# Patient Record
Sex: Female | Born: 1995 | Race: Black or African American | Hispanic: No | Marital: Single | Smoking: Never smoker
Health system: Southern US, Community
[De-identification: ages and names within clinical notes are randomized; demographics above are authoritative.]

## PROBLEM LIST (undated history)

## (undated) DIAGNOSIS — D573 Sickle-cell trait: Secondary | ICD-10-CM

---

## 2020-09-07 ENCOUNTER — Encounter (HOSPITAL_COMMUNITY): Payer: Self-pay | Admitting: Obstetrics & Gynecology

## 2020-09-07 ENCOUNTER — Inpatient Hospital Stay (HOSPITAL_COMMUNITY): Payer: BLUE CROSS/BLUE SHIELD

## 2020-09-07 ENCOUNTER — Inpatient Hospital Stay (HOSPITAL_COMMUNITY)
Admission: AD | Admit: 2020-09-07 | Discharge: 2020-09-08 | Disposition: A | Discharge status: Home / Self Care | Payer: BLUE CROSS/BLUE SHIELD | Attending: Obstetrics & Gynecology | Admitting: Obstetrics & Gynecology

## 2020-09-07 ENCOUNTER — Other Ambulatory Visit: Payer: Self-pay

## 2020-09-07 DIAGNOSIS — O26891 Other specified pregnancy related conditions, first trimester: Secondary | ICD-10-CM | POA: Diagnosis not present

## 2020-09-07 DIAGNOSIS — Z3A01 Less than 8 weeks gestation of pregnancy: Secondary | ICD-10-CM | POA: Diagnosis not present

## 2020-09-07 DIAGNOSIS — Z3491 Encounter for supervision of normal pregnancy, unspecified, first trimester: Secondary | ICD-10-CM

## 2020-09-07 DIAGNOSIS — R109 Unspecified abdominal pain: Secondary | ICD-10-CM

## 2020-09-07 DIAGNOSIS — O219 Vomiting of pregnancy, unspecified: Secondary | ICD-10-CM | POA: Insufficient documentation

## 2020-09-07 DIAGNOSIS — R1030 Lower abdominal pain, unspecified: Secondary | ICD-10-CM | POA: Insufficient documentation

## 2020-09-07 HISTORY — DX: Sickle-cell trait: D57.3

## 2020-09-07 LAB — CBC
HCT: 36.3 % (ref 36.0–46.0)
Hemoglobin: 12.5 g/dL (ref 12.0–15.0)
MCH: 26.3 pg (ref 26.0–34.0)
MCHC: 34.4 g/dL (ref 30.0–36.0)
MCV: 76.4 fL — ABNORMAL LOW (ref 80.0–100.0)
Platelets: 274 10*3/uL (ref 150–400)
RBC: 4.75 MIL/uL (ref 3.87–5.11)
RDW: 13.9 % (ref 11.5–15.5)
WBC: 7.5 10*3/uL (ref 4.0–10.5)
nRBC: 0 % (ref 0.0–0.2)

## 2020-09-07 LAB — URINALYSIS, ROUTINE W REFLEX MICROSCOPIC
Bilirubin Urine: NEGATIVE
Glucose, UA: NEGATIVE mg/dL
Hgb urine dipstick: NEGATIVE
Ketones, ur: 80 mg/dL — AB
Nitrite: NEGATIVE
Protein, ur: 30 mg/dL — AB
Specific Gravity, Urine: 1.02 (ref 1.005–1.030)
pH: 5 (ref 5.0–8.0)

## 2020-09-07 LAB — WET PREP, GENITAL
Clue Cells Wet Prep HPF POC: NONE SEEN
Sperm: NONE SEEN
Trich, Wet Prep: NONE SEEN
Yeast Wet Prep HPF POC: NONE SEEN

## 2020-09-07 LAB — ABO/RH: ABO/RH(D): O POS

## 2020-09-07 LAB — HCG, QUANTITATIVE, PREGNANCY: hCG, Beta Chain, Quant, S: 110536 m[IU]/mL — ABNORMAL HIGH (ref <5)

## 2020-09-07 LAB — POCT PREGNANCY, URINE: Preg Test, Ur: POSITIVE — AB

## 2020-09-07 MED ORDER — FAMOTIDINE IN NACL 20-0.9 MG/50ML-% IV SOLN
20.0000 mg | Freq: Once | INTRAVENOUS | Status: AC
  Administered 2020-09-08: 20 mg via INTRAVENOUS
  Filled 2020-09-07: qty 50

## 2020-09-07 MED ORDER — PROMETHAZINE HCL 25 MG/ML IJ SOLN
12.5000 mg | Freq: Once | INTRAMUSCULAR | Status: AC
  Administered 2020-09-07: 12.5 mg via INTRAVENOUS
  Filled 2020-09-07: qty 1

## 2020-09-07 MED ORDER — M.V.I. ADULT IV INJ
Freq: Once | INTRAVENOUS | Status: AC
  Filled 2020-09-07: qty 10

## 2020-09-07 MED ORDER — LACTATED RINGERS IV BOLUS
1000.0000 mL | Freq: Once | INTRAVENOUS | Status: AC
  Administered 2020-09-07: 1000 mL via INTRAVENOUS

## 2020-09-07 NOTE — MAU Note (Signed)
Pt states she just found out she was pregnant at [facility]. She presented there with abd pain and nausea and vomiting. They did a pregnancy test and nothing else. Was told due to the abd pain it may be an ectopic so she came here for eval

## 2020-09-07 NOTE — MAU Provider Note (Signed)
History     CSN: [id number]  Arrival date and time: 09/07/20 2015   First Provider Initiated Contact with Patient 09/07/20 2305      Chief Complaint  Patient presents with  . Abdominal Pain  . Possible Pregnancy   Alexa Welch is a 24 y.o. G1P0 at [redacted]w[redacted]d by LMP who presents to MAU with complaints of abdominal pain and nausea/vomiting. Patient reports that symptoms started occurring yesterday, patient went to [facility] urgent care for evaluation and was told she was pregnant. Denies any lab work being done at urgent care. Patient describes the abdominal pain as lower abdominal cramping and sharp pain that is intermittent. Denies taking any medication for abdominal pain. Patient reports nausea and vomiting throughout the day and being unable to keep anything down. Patient reports emesis 8+ times over the past 24 hours. She denies vaginal bleeding or vaginal discharge.    OB History    Gravida  1   Para      Term      Preterm      AB      Living        SAB      TAB      Ectopic      Multiple      Live Births              Past Medical History:  Diagnosis Date  . Sickle cell trait (HCC)     History reviewed. No pertinent surgical history.  History reviewed. No pertinent family history.  Social History   Tobacco Use  . Smoking status: Never Smoker  . Smokeless tobacco: Never Used  Vaping Use  . Vaping Use: Never used  Substance Use Topics  . Alcohol use: Never  . Drug use: Never    Allergies: No Known Allergies  No medications prior to admission.    Review of Systems  Constitutional: Negative.   Respiratory: Negative.   Cardiovascular: Negative.   Gastrointestinal: Positive for abdominal pain, nausea and vomiting. Negative for constipation and diarrhea.  Genitourinary: Negative.   Musculoskeletal: Negative.   Neurological: Negative.   Psychiatric/Behavioral: Negative.    Physical Exam   Blood pressure 120/68, pulse 81, temperature 98.7 F (37.1  C), temperature source Oral, resp. rate 17, height 5\' 6"  (1.676 m), weight 70.3 kg, last menstrual period 08/05/2020, SpO2 100 %.  Physical Exam Vitals and nursing note reviewed.  Constitutional:      Appearance: Normal appearance.  HENT:     Head: Normocephalic.  Cardiovascular:     Rate and Rhythm: Normal rate and regular rhythm.  Pulmonary:     Effort: Pulmonary effort is normal.     Breath sounds: Normal breath sounds.  Abdominal:     General: There is no distension.     Palpations: Abdomen is soft. There is no mass.     Tenderness: There is no abdominal tenderness. There is no guarding.  Genitourinary:    Comments: Self swabs collected Skin:    General: Skin is warm and dry.  Neurological:     Mental Status: She is alert and oriented to person, place, and time.  Psychiatric:        Mood and Affect: Mood normal.        Behavior: Behavior normal.        Thought Content: Thought content normal.    MAU Course  Procedures  MDM Orders Placed This Encounter  Procedures  . Wet prep, genital  . 08/07/2020 OB LESS  THAN 14 WEEKS WITH OB TRANSVAGINAL  . Urinalysis, Routine w reflex microscopic Urine, Clean Catch  . CBC  . hCG, quantitative, pregnancy  . Pregnancy, urine POC  . ABO/Rh  . Insert peripheral IV   Meds ordered this encounter  Medications  . lactated ringers bolus 1,000 mL  . promethazine (PHENERGAN) injection 12.5 mg  . multivitamins adult (INFUVITE ADULT) 10 mL in lactated ringers 1,000 mL infusion   Treatments in MAU included LR bolus, phenergan IV and MVI.   Labs and [place] report reviewed:  Results for orders placed or performed during the hospital encounter of 09/07/20 (from the past 24 hour(s))  Urinalysis, Routine w reflex microscopic     Status: Abnormal   Collection Time: 09/07/20  9:30 PM  Result Value Ref Range   Color, Urine YELLOW YELLOW   APPearance CLOUDY (A) CLEAR   Specific Gravity, Urine 1.020 1.005 - 1.030   pH 5.0 5.0 - 8.0   Glucose, UA  NEGATIVE NEGATIVE mg/dL   Hgb urine dipstick NEGATIVE NEGATIVE   Bilirubin Urine NEGATIVE NEGATIVE   Ketones, ur 80 (A) NEGATIVE mg/dL   Protein, ur 30 (A) NEGATIVE mg/dL   Nitrite NEGATIVE NEGATIVE   Leukocytes,Ua TRACE (A) NEGATIVE   RBC / HPF 0-5 0 - 5 RBC/hpf   WBC, UA 6-10 0 - 5 WBC/hpf   Bacteria, UA RARE (A) NONE SEEN   Squamous Epithelial / LPF 11-20 0 - 5   Mucus PRESENT   Pregnancy, urine POC     Status: Abnormal   Collection Time: 09/07/20  9:30 PM  Result Value Ref Range   Preg Test, Ur POSITIVE (A) NEGATIVE  CBC     Status: Abnormal   Collection Time: 09/07/20  9:39 PM  Result Value Ref Range   WBC 7.5 4.0 - 10.5 K/uL   RBC 4.75 3.87 - 5.11 MIL/uL   Hemoglobin 12.5 12.0 - 15.0 g/dL   HCT 09.3 36 - 46 %   MCV 76.4 (L) 80.0 - 100.0 fL   MCH 26.3 26.0 - 34.0 pg   MCHC 34.4 30.0 - 36.0 g/dL   RDW 26.7 12.4 - 58.0 %   Platelets 274 150 - 400 K/uL   nRBC 0.0 0.0 - 0.2 %  ABO/Rh     Status: None   Collection Time: 09/07/20  9:39 PM  Result Value Ref Range   ABO/RH(D) O POS    No rh immune globuloin      NOT A RH IMMUNE GLOBULIN CANDIDATE, PT RH POSITIVE Performed at [facility], [address] [place], [place] [address]   hCG, quantitative, pregnancy     Status: Abnormal   Collection Time: 09/07/20  9:39 PM  Result Value Ref Range   hCG, Beta Chain, Quant, S 110,536 (H) <5 mIU/mL  Wet prep, genital     Status: Abnormal   Collection Time: 09/07/20 10:11 PM  Result Value Ref Range   Yeast Wet Prep HPF POC NONE SEEN NONE SEEN   Trich, Wet Prep NONE SEEN NONE SEEN   Clue Cells Wet Prep HPF POC NONE SEEN NONE SEEN   WBC, Wet Prep HPF POC FEW (A) NONE SEEN   Sperm NONE SEEN    US OB LESS THAN 14 WEEKS WITH OB TRANSVAGINAL  Result Date: 09/07/2020 CLINICAL DATA:  Abdominal pain EXAM: OBSTETRIC <14 WK [place] AND TRANSVAGINAL OB US TECHNIQUE: Both transabdominal and transvaginal ultrasound examinations were performed for complete evaluation of the gestation as  well as  the maternal uterus, adnexal regions, and pelvic cul-de-sac. Transvaginal technique was performed to assess early pregnancy. COMPARISON:  None. FINDINGS: Intrauterine gestational sac: Single Yolk sac:  Visualized. Embryo:  Visualized. Cardiac Activity: Visualized. Heart Rate: 132 bpm CRL:  7.6 mm   6 w   4 d                  [place] EDC: 04/29/2021 Subchorionic hemorrhage:  None visualized. Maternal uterus/adnexae: Probable small corpus luteum cyst seen within the right ovary. The left ovary is normal in appearance. There is trace free fluid in the cul-de-sac. IMPRESSION: Single live intrauterine pregnancy measuring 6 weeks 4 days Electronically Signed   By: Jonna Clark M.D.   On: 09/07/2020 22:44   PO challenge initiated after [place] and labs resulted. Discussed with patient results of [place] and lab work. Normal IUP seen at [redacted]w[redacted]d based on [place] today- dating changed. Encouraged patient to make initial prenatal appointment. Discussed reasons to return to MAU. PO challenge passed and patient able to keep down crackers and gingerale. Rx for phenergan sent to pharmacy of choice.  Pt stable at time of discharge.   Assessment and Plan   1. Normal IUP (intrauterine pregnancy) on prenatal ultrasound, first trimester   2. Abdominal pain during pregnancy in first trimester   3. [redacted] weeks gestation of pregnancy   4. Nausea and vomiting during pregnancy prior to [redacted] weeks gestation    .Discharge home Make appointment to initiate prenatal care Return to MAU as needed for reasons discussed and/or emergencies  Rx for phenergan   Allergies as of 09/08/2020   No Known Allergies     Medication List    TAKE these medications   promethazine 12.5 MG tablet Commonly known as: PHENERGAN Take 1 tablet (12.5 mg total) by mouth every 6 (six) hours as needed for nausea or vomiting.       Sharyon Cable CNM 09/08/2020, 5:17 AM

## 2020-09-08 LAB — GC/CHLAMYDIA PROBE AMP (~~LOC~~) NOT AT ARMC
Chlamydia: NEGATIVE
Comment: NEGATIVE
Comment: NORMAL
Neisseria Gonorrhea: NEGATIVE

## 2020-09-08 MED ORDER — PROMETHAZINE HCL 12.5 MG PO TABS
12.5000 mg | ORAL_TABLET | Freq: Four times a day (QID) | ORAL | 0 refills | Status: AC | PRN
Start: 2020-09-08 — End: ?

## 2022-06-03 IMAGING — US US OB < 14 WEEKS - US OB TV
1 series · 15 of 28 positions shown · non-contrast
Comparison: None.

CLINICAL DATA: Abdominal pain

EXAM:
OBSTETRIC <14 WK US AND TRANSVAGINAL OB US
TECHNIQUE: Both transabdominal and transvaginal ultrasound examinations were
performed for complete evaluation of the gestation as well as the
maternal uterus, adnexal regions, and pelvic cul-de-sac.
Transvaginal technique was performed to assess early pregnancy.

[Series 1: us ob < 14 weeks - us ob tv · 15 of 36 slices shown]
[im 1/36]
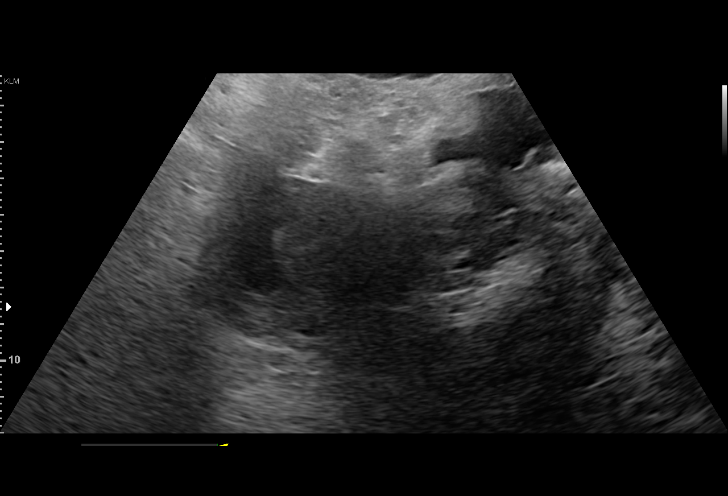
[im 3/36]
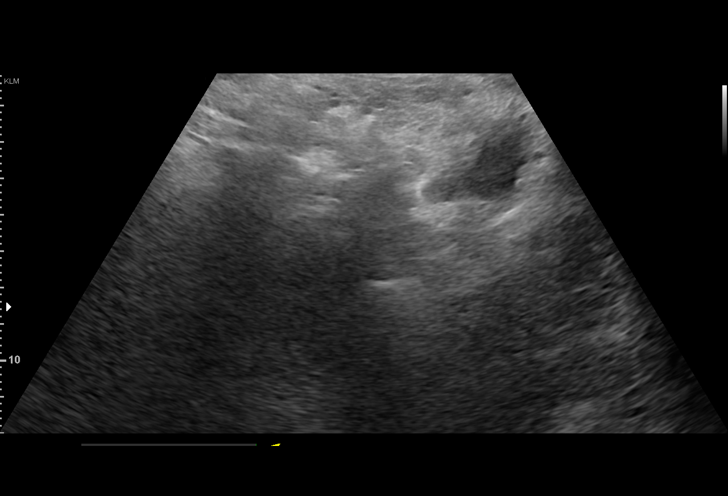
[im 6/36]
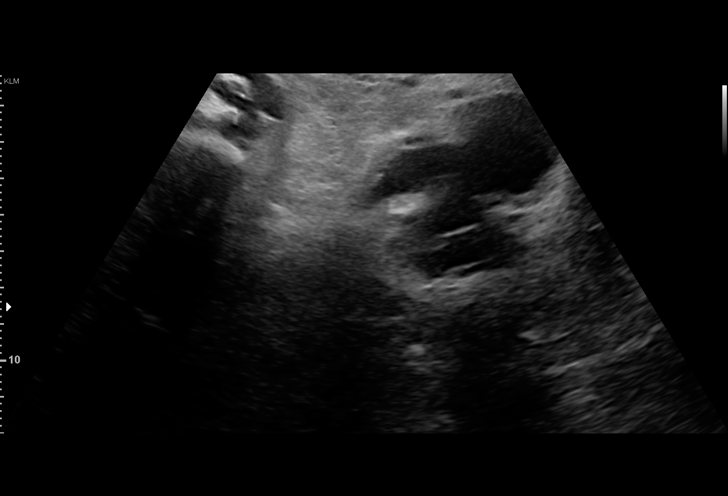
[im 8/36]
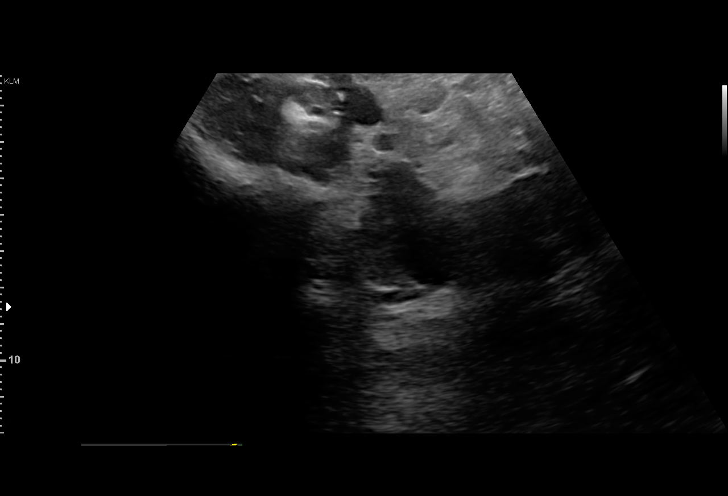
[im 11/36]
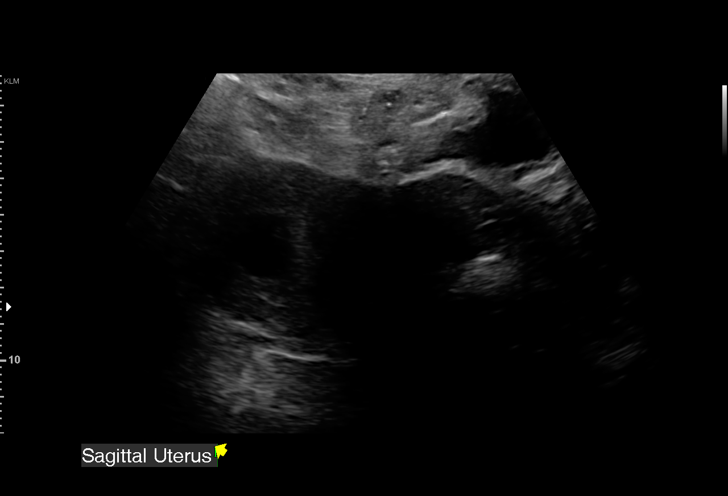
[im 13/36]
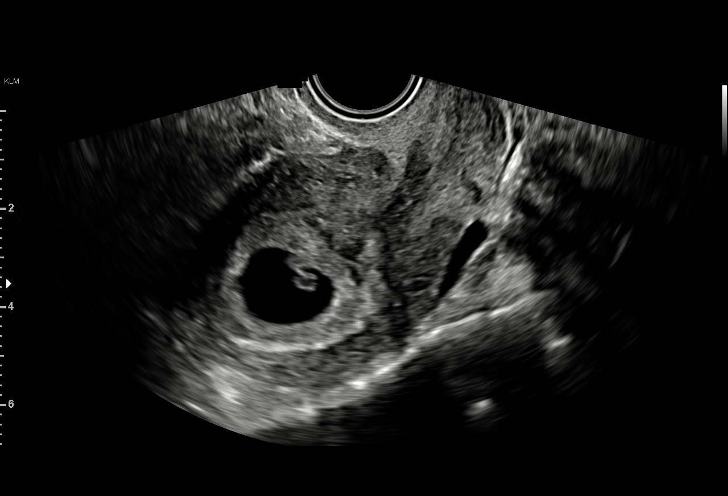
[im 16/36]
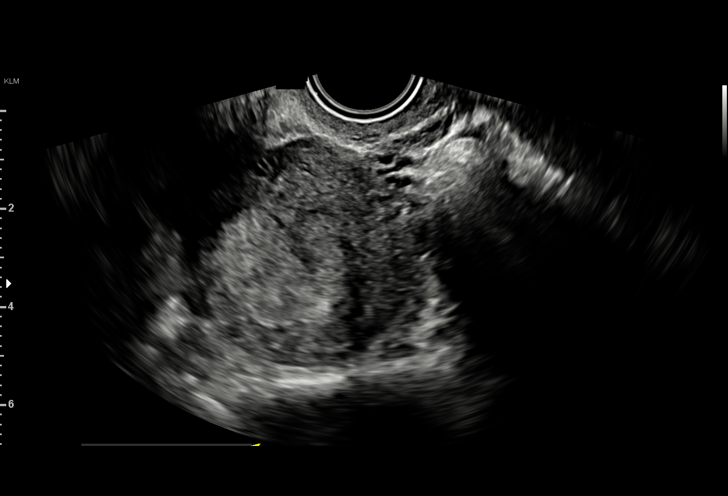
[im 19/36]
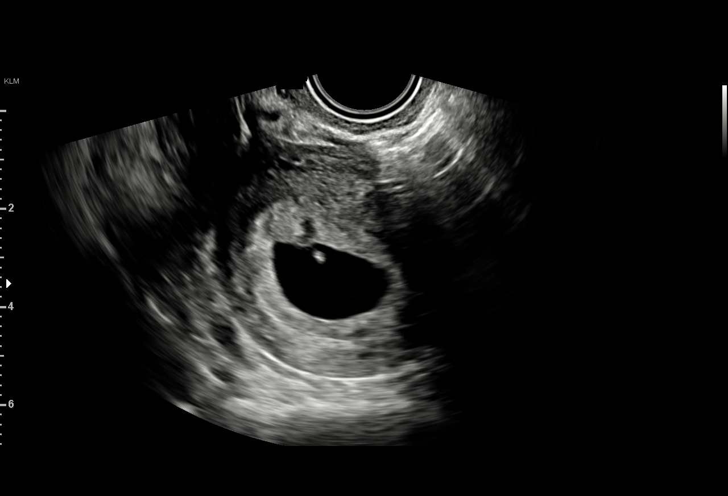
[im 20/36]
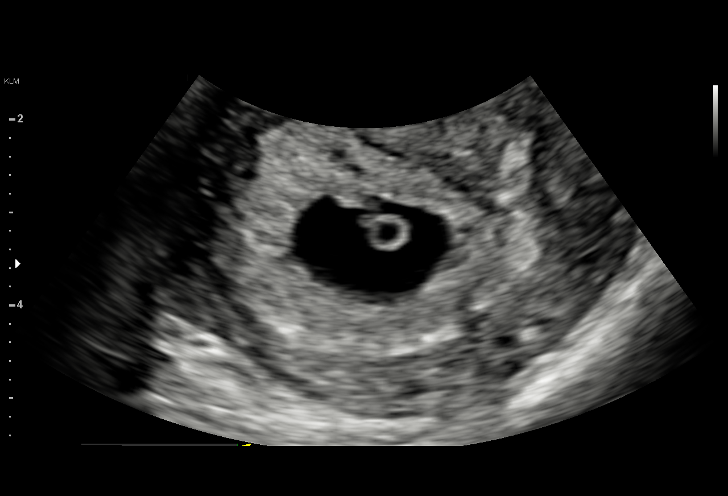
[im 23/36]
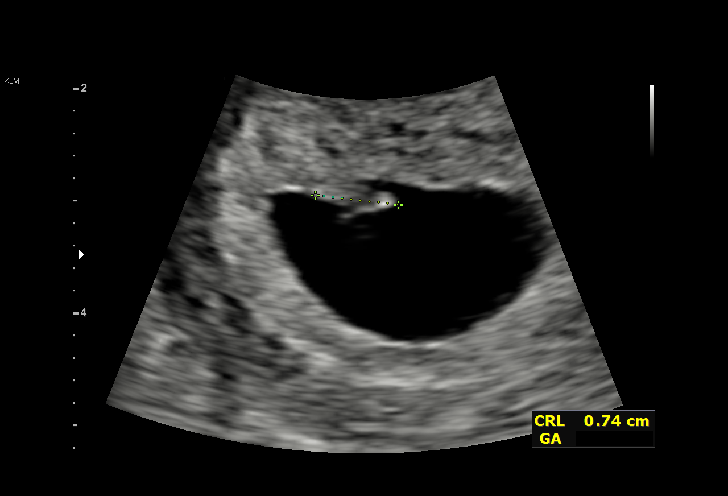
[im 25/36]
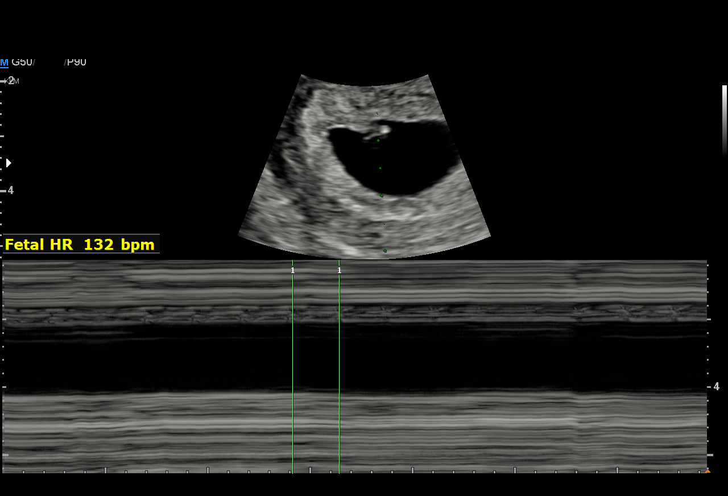
[im 28/36]
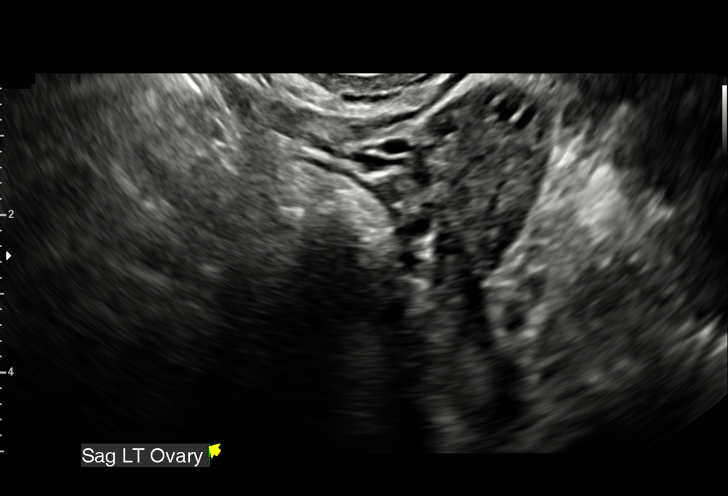
[im 30/36]
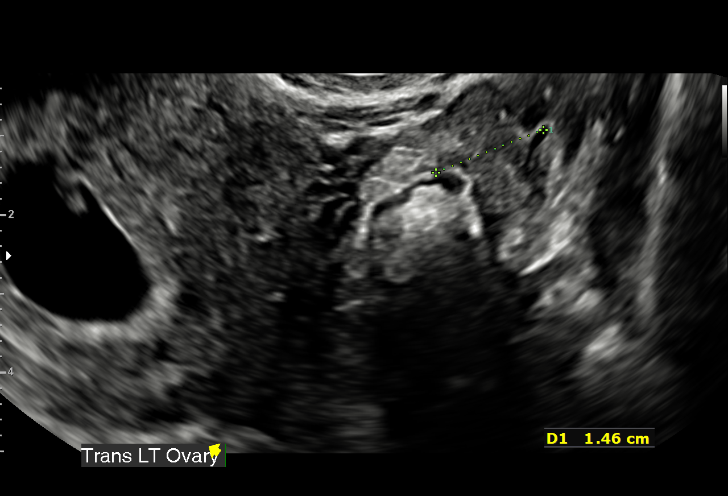
[im 33/36]
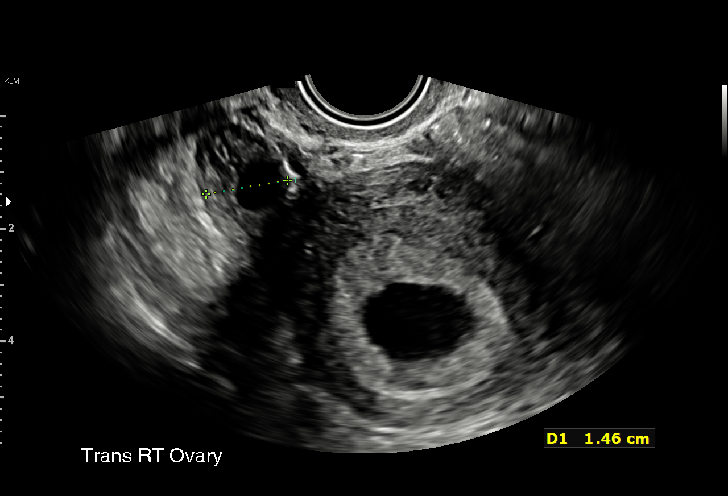
[im 36/36]
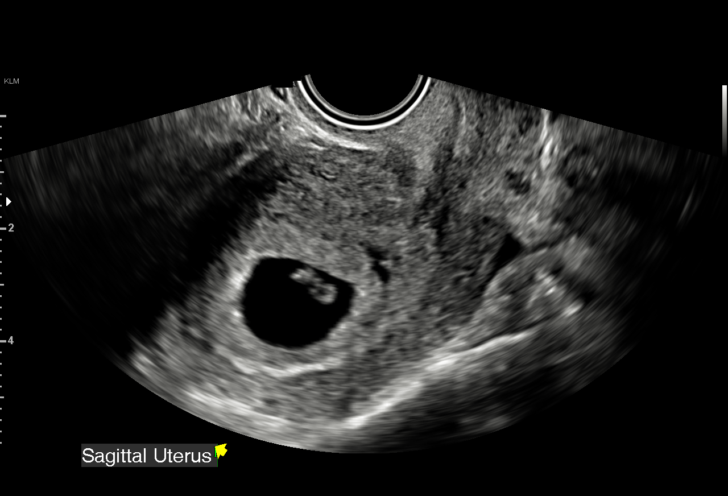

[15 of 28 positions shown; findings below may reference images not displayed]

FINDINGS: Intrauterine gestational sac: Single

Yolk sac:  Visualized.

Embryo:  Visualized.

Cardiac Activity: Visualized.

Heart Rate: 132 bpm

CRL:  7.6 mm   6 w   4 d                  US EDC: 04/29/2021

Subchorionic hemorrhage:  None visualized.

Maternal uterus/adnexae: Probable small corpus luteum cyst seen
within the right ovary. The left ovary is normal in appearance.
There is trace free fluid in the cul-de-sac.
IMPRESSION: Single live intrauterine pregnancy measuring 6 weeks 4 days
